# Patient Record
Sex: Female | Born: 1940 | Race: White | Hispanic: No | Marital: Married | Smoking: Never smoker
Health system: Southern US, Community
[De-identification: ages and names within clinical notes are randomized; demographics above are authoritative.]

## PROBLEM LIST (undated history)

## (undated) DIAGNOSIS — C801 Malignant (primary) neoplasm, unspecified: Secondary | ICD-10-CM

## (undated) HISTORY — PX: ABDOMINAL HYSTERECTOMY: SHX81

---

## 2007-07-05 DIAGNOSIS — Z9889 Other specified postprocedural states: Secondary | ICD-10-CM | POA: Insufficient documentation

## 2011-12-18 DIAGNOSIS — M1991 Primary osteoarthritis, unspecified site: Secondary | ICD-10-CM | POA: Insufficient documentation

## 2011-12-18 DIAGNOSIS — M797 Fibromyalgia: Secondary | ICD-10-CM | POA: Insufficient documentation

## 2011-12-18 DIAGNOSIS — L719 Rosacea, unspecified: Secondary | ICD-10-CM | POA: Insufficient documentation

## 2012-01-14 ENCOUNTER — Ambulatory Visit: Payer: Self-pay | Admitting: Unknown Physician Specialty

## 2012-01-14 LAB — CREATININE, SERUM
Creatinine: 0.7 mg/dL (ref 0.60–1.30)
EGFR (African American): >60
EGFR (Non-African Amer.): >60

## 2012-07-14 DIAGNOSIS — F330 Major depressive disorder, recurrent, mild: Secondary | ICD-10-CM | POA: Insufficient documentation

## 2012-07-14 DIAGNOSIS — R739 Hyperglycemia, unspecified: Secondary | ICD-10-CM | POA: Insufficient documentation

## 2013-07-20 DIAGNOSIS — Z800 Family history of malignant neoplasm of digestive organs: Secondary | ICD-10-CM | POA: Insufficient documentation

## 2013-07-20 DIAGNOSIS — I10 Essential (primary) hypertension: Secondary | ICD-10-CM | POA: Insufficient documentation

## 2013-09-20 DIAGNOSIS — M62830 Muscle spasm of back: Secondary | ICD-10-CM | POA: Insufficient documentation

## 2014-01-11 DIAGNOSIS — E039 Hypothyroidism, unspecified: Secondary | ICD-10-CM | POA: Insufficient documentation

## 2014-01-22 DIAGNOSIS — M8580 Other specified disorders of bone density and structure, unspecified site: Secondary | ICD-10-CM | POA: Insufficient documentation

## 2014-01-22 DIAGNOSIS — R0609 Other forms of dyspnea: Secondary | ICD-10-CM | POA: Insufficient documentation

## 2014-01-22 DIAGNOSIS — K219 Gastro-esophageal reflux disease without esophagitis: Secondary | ICD-10-CM | POA: Insufficient documentation

## 2014-01-22 DIAGNOSIS — I2510 Atherosclerotic heart disease of native coronary artery without angina pectoris: Secondary | ICD-10-CM | POA: Insufficient documentation

## 2014-01-22 DIAGNOSIS — Z0000 Encounter for general adult medical examination without abnormal findings: Secondary | ICD-10-CM | POA: Insufficient documentation

## 2014-01-22 DIAGNOSIS — E785 Hyperlipidemia, unspecified: Secondary | ICD-10-CM | POA: Insufficient documentation

## 2014-01-22 DIAGNOSIS — E538 Deficiency of other specified B group vitamins: Secondary | ICD-10-CM | POA: Insufficient documentation

## 2014-06-30 ENCOUNTER — Ambulatory Visit
Admit: 2014-06-30 | Disposition: A | Discharge status: Home / Self Care | Payer: Self-pay | Attending: Unknown Physician Specialty | Admitting: Unknown Physician Specialty

## 2014-07-26 DIAGNOSIS — Z974 Presence of external hearing-aid: Secondary | ICD-10-CM | POA: Insufficient documentation

## 2015-08-17 DIAGNOSIS — Z9889 Other specified postprocedural states: Secondary | ICD-10-CM | POA: Insufficient documentation

## 2016-02-06 DIAGNOSIS — M1711 Unilateral primary osteoarthritis, right knee: Secondary | ICD-10-CM | POA: Insufficient documentation

## 2016-04-04 DIAGNOSIS — R0602 Shortness of breath: Secondary | ICD-10-CM | POA: Insufficient documentation

## 2019-06-30 DIAGNOSIS — Z860101 Personal history of adenomatous and serrated colon polyps: Secondary | ICD-10-CM | POA: Insufficient documentation

## 2019-11-09 DIAGNOSIS — R55 Syncope and collapse: Secondary | ICD-10-CM | POA: Insufficient documentation

## 2020-09-18 NOTE — Progress Notes (Signed)
[facility] [address] [place], [place] [address]  Pulmonary Sleep Medicine   Office Visit Note  Patient Name: Gloria Cortez DOB: October 29, 1940 MRN [id number]    Chief Complaint: Obstructive Sleep Apnea visit  Brief History:  Nil is seen today for initial consult for annual follow up on APAP@12 -16cmH20. The patient has a 11 year history of sleep apnea. Patient is using PAP nightly.  The patient feels rested after sleeping with PAP.  The patient reports benefit  from PAP use. Reported sleepiness is  improved and the Epworth Sleepiness Score is 10 out of 24. The patient doesn't  take naps. Patient goes to sleep 12-1am and wakes at 6-8am.The patient complains of the following: air temp is too warm--will show how to adjust  The compliance download shows 98% compliance with an average use time of 8.5 hours. The AHI is 4.5  The patient does not complain of limb movements disrupting sleep.  ROS  General: (-) fever, (-) chills, (-) night sweat Nose and Sinuses: (-) nasal stuffiness or itchiness, (-) postnasal drip, (-) nosebleeds, (-) sinus trouble. Mouth and Throat: (-) sore throat, (-) hoarseness. Neck: (-) swollen glands, (-) enlarged thyroid, (-) neck pain. Respiratory: - cough, - shortness of breath, - wheezing. Neurologic: - numbness, - tingling. Psychiatric: - anxiety, - depression   Current Medication: Outpatient Encounter Medications as of 09/19/2020  Medication Sig   clonazePAM (KLONOPIN) 0.5 MG tablet Take by mouth.   cyclobenzaprine (FLEXERIL) 10 MG tablet TAKE ONE TABLET BY MOUTH THREE TIMES DAILY AS NEEDED FOR SPASM   escitalopram (LEXAPRO) 10 MG tablet    furosemide (LASIX) 40 MG tablet furosemide 40 mg tablet   levothyroxine (SYNTHROID) 125 MCG tablet levothyroxine 125 mcg tablet   losartan (COZAAR) 50 MG tablet TAKE 1 TABLET(50 MG) BY MOUTH TWICE DAILY   metoprolol succinate (TOPROL-XL) 25 MG 24 hr tablet Take by mouth.   rosuvastatin (CRESTOR) 10 MG  tablet rosuvastatin 10 mg tablet   aspirin 325 MG tablet aspirin 325 mg tablet  Take 1 tablet every day by oral route.   nitroGLYCERIN (NITROSTAT) 0.4 MG SL tablet nitroglycerin 0.4 mg sublingual tablet  Place by sublingual route.   No facility-administered encounter medications on file as of 09/19/2020.    Surgical History: History reviewed. No pertinent surgical history.  Medical History: History reviewed. No pertinent past medical history.  Family History: Non contributory to the present illness  Social History: Social History   Socioeconomic History   Marital status: Married    Spouse name: Not on file   Number of children: Not on file   Years of education: Not on file   Highest education level: Not on file  Occupational History   Not on file  Tobacco Use   Smoking status: Never   Smokeless tobacco: Never  Substance and Sexual Activity   Alcohol use: Not Currently   Drug use: Not on file   Sexual activity: Not on file  Other Topics Concern   Not on file  Social History Narrative   Not on file   Social Determinants of Health   Financial Resource Strain: Not on file  Food Insecurity: Not on file  Transportation Needs: Not on file  Physical Activity: Not on file  Stress: Not on file  Social Connections: Not on file  Intimate Partner Violence: Not on file    Vital Signs: Blood pressure 113/69, pulse 70, temperature 98.1 F (36.7 C), resp. rate 18, height 4\' 10"  (1.473 m), weight  197 lb (89.4 kg), SpO2 96 %.  Examination: General Appearance: The patient is well-developed, well-nourished, and in no distress. Neck Circumference: 40cm Skin: Gross inspection of skin unremarkable. Head: normocephalic, no gross deformities. Eyes: no gross deformities noted. ENT: ears appear grossly normal Neurologic: Alert and oriented. No involuntary movements.    EPWORTH SLEEPINESS SCALE:  Scale:  (0)= no chance of dozing; (1)= slight chance of dozing; (2)= moderate  chance of dozing; (3)= high chance of dozing  Chance  Situtation    Sitting and reading: 2    Watching TV: 2    Sitting Inactive in public: 1    As a passenger in car: 2      Lying down to rest: 0    Sitting and talking: 1    Sitting quielty after lunch: 2    In a car, stopped in traffic: 0   TOTAL SCORE:   10 out of 24    SLEEP STUDIES:  PSG 10/12 AHI 66 Spo60min 80%   CPAP COMPLIANCE DATA:  Date Range: 09/17/19-09/15/20  Average Daily Use: 8.5 hours  Median Use: 8.9  Compliance for > 4 Hours: 99%   AHI: 4.5 respiratory events per hour  Days Used: 361/365  Mask Leak: 43.7  95th Percentile Pressure: APAP 12-16         LABS: No results found for this or any previous visit (from the past 2160 hour(s)).  Radiology: MR Brain W Wo Contrast  Result Date: 06/30/2014 CLINICAL DATA:  Dizziness. Headache. Hearing loss. Symptoms began 2 years ago after falling in the shower with trauma to the back of the head. EXAM: MRI HEAD WITHOUT AND WITH CONTRAST TECHNIQUE: Multiplanar, multiecho pulse sequences of the brain and surrounding structures were obtained without and with intravenous contrast. CONTRAST:  20 cc MultiHance COMPARISON:  01/14/2012 FINDINGS: Diffusion imaging does not show any acute or subacute infarction. The brainstem and cerebellum are normal. The cerebral hemispheres show moderate changes of chronic small vessel disease affecting the deep and subcortical white matter. No cortical or large vessel territory infarction. No mass lesion, hemorrhage, hydrocephalus or extra-axial collection. CP angle regions are normal bilaterally. Seventh and eighth nerve complexes are normal. No vestibular schwannoma. No abnormal brain or meningeal enhancement. No fluid in the sinuses, middle ears or mastoids. No skull or skullbase lesion. 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 No acute or reversible finding. No  vestibular schwannoma. Electronically Signed   By: Paulina Fusi M.D.   On: 06/30/2014 18:34    No results found.  No results found.    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 The patient was reminded how to adjust humidity and advised to change supplies regularly to avoid mask leak. The patient was also counselled on nightly use. The compliance is excellent. The AHI is 4.5.   1. Obstructive sleep apnea syndrome Continue excellent compliance  2. CPAP use counseling CPAP couseling-Discussed importance of adequate CPAP use as well as proper care and cleaning techniques of machine and all supplies.  3. Essential (primary) hypertension Continue current medication and f/u with PCP.  4. Acquired hypothyroidism Continue current medication and f/u with PCP.  5. Mild recurrent major depression (HCC) Stable, continue current medication  6. Morbid obesity with body mass index (BMI) of 40.0 to 44.9 in adult [facility]) Obesity Counseling: Had a lengthy discussion regarding patients BMI and weight issues. Patient was instructed on portion control as well as increased activity. Also discussed caloric restrictions with trying to maintain intake less than 2000 Kcal. Discussions were made in accordance with the 5As of weight management. Simple actions such as not eating late and if able to, taking a walk is suggested.    General Counseling: I have discussed the findings of the evaluation and examination with Marialuiza.  I have also discussed any  further diagnostic evaluation thatmay be needed or ordered today. Tishawna verbalizes understanding of the findings of todays visit. We also reviewed her medications today and discussed drug interactions and side effects including but not limited excessive drowsiness and altered mental states. We also discussed that there is always a risk not just to her but also people around her. she has been encouraged to call the office with any questions or concerns that should arise related to todays visit.  No orders of the defined types were placed in this encounter.       I have personally obtained a history, examined the patient, evaluated laboratory and imaging results, formulated the assessment and plan and placed orders.  This patient was seen by Lynn Ito, PA-C in collaboration with Dr. Freda Munro as a part of collaborative care agreement.   Valentino Hue Sol Blazing, PhD, FAASM  Diplomate, American Board of Sleep Medicine    Yevonne Pax, MD [facility] Diplomate ABMS Pulmonary and Critical Care Medicine Sleep medicine

## 2020-09-19 ENCOUNTER — Ambulatory Visit (INDEPENDENT_AMBULATORY_CARE_PROVIDER_SITE_OTHER): Payer: Medicare PPO | Admitting: Internal Medicine

## 2020-09-19 VITALS — BP 113/69 | HR 70 | Temp 98.1°F | Resp 18 | Ht <= 58 in | Wt 197.0 lb

## 2020-09-19 DIAGNOSIS — G4700 Insomnia, unspecified: Secondary | ICD-10-CM | POA: Insufficient documentation

## 2020-09-19 DIAGNOSIS — G4733 Obstructive sleep apnea (adult) (pediatric): Secondary | ICD-10-CM | POA: Diagnosis not present

## 2020-09-19 DIAGNOSIS — F330 Major depressive disorder, recurrent, mild: Secondary | ICD-10-CM

## 2020-09-19 DIAGNOSIS — Z6841 Body Mass Index (BMI) 40.0 and over, adult: Secondary | ICD-10-CM

## 2020-09-19 DIAGNOSIS — E039 Hypothyroidism, unspecified: Secondary | ICD-10-CM | POA: Diagnosis not present

## 2020-09-19 DIAGNOSIS — I10 Essential (primary) hypertension: Secondary | ICD-10-CM | POA: Diagnosis not present

## 2020-09-19 DIAGNOSIS — Z7189 Other specified counseling: Secondary | ICD-10-CM

## 2020-09-19 DIAGNOSIS — F32A Depression, unspecified: Secondary | ICD-10-CM | POA: Insufficient documentation

## 2020-09-19 DIAGNOSIS — G4730 Sleep apnea, unspecified: Secondary | ICD-10-CM | POA: Insufficient documentation

## 2020-09-19 NOTE — Patient Instructions (Signed)

## 2021-03-18 DIAGNOSIS — R350 Frequency of micturition: Secondary | ICD-10-CM | POA: Insufficient documentation

## 2021-03-25 DIAGNOSIS — L209 Atopic dermatitis, unspecified: Secondary | ICD-10-CM | POA: Insufficient documentation

## 2021-09-17 NOTE — Progress Notes (Signed)
No show for appointment. Office will call to reschedule.  

## 2021-09-18 ENCOUNTER — Ambulatory Visit: Payer: Medicare PPO | Admitting: Internal Medicine

## 2021-09-18 DIAGNOSIS — Z91199 Patient's noncompliance with other medical treatment and regimen due to unspecified reason: Secondary | ICD-10-CM

## 2021-09-23 DIAGNOSIS — J302 Other seasonal allergic rhinitis: Secondary | ICD-10-CM | POA: Insufficient documentation

## 2021-11-25 NOTE — Progress Notes (Signed)
[facility] [address] [place], [place] [address]  Pulmonary Sleep Medicine   Office Visit Note  Patient Name: Gloria Cortez DOB: October 07, 1940 MRN [id number]    Chief Complaint: Obstructive Sleep Apnea visit  Brief History:  Gloria Cortez is seen today for an annual follow up visit for APAP@ 12-16 cmH2O. The patient has a 11 year history of sleep apnea. Patient is using PAP nightly.  The patient feels rested after sleeping with PAP.  The patient reports benefiting from PAP use. Reported sleepiness is  improved and the Epworth Sleepiness Score is 6 out of 24. The patient does take occasional naps. The patient complains of the following: place on her nose due to mask. She thinks it might be something her dermatologist will need to check due to a previous lesion there.   The compliance download shows 99% compliance with an average use time of 8 hours 50 minutes. The AHI is 4.1.  The patient does not complain of limb movements disrupting sleep. The patient continues to require PAP therapy in order to eliminate sleep apnea.   ROS  General: (-) fever, (-) chills, (-) night sweat Nose and Sinuses: (-) nasal stuffiness or itchiness, (-) postnasal drip, (-) nosebleeds, (-) sinus trouble. Mouth and Throat: (-) sore throat, (-) hoarseness. Neck: (-) swollen glands, (-) enlarged thyroid, (-) neck pain. Respiratory: - cough, - shortness of breath, - wheezing. Neurologic: - numbness, - tingling. Psychiatric: - anxiety, - depression   Current Medication: Outpatient Encounter Medications as of 11/26/2021  Medication Sig   aspirin 325 MG tablet aspirin 325 mg tablet  Take 1 tablet every day by oral route.   cyclobenzaprine (FLEXERIL) 10 MG tablet TAKE ONE TABLET BY MOUTH THREE TIMES DAILY AS NEEDED FOR SPASM   escitalopram (LEXAPRO) 10 MG tablet    furosemide (LASIX) 40 MG tablet furosemide 40 mg tablet   levothyroxine (SYNTHROID) 125 MCG tablet levothyroxine 125 mcg tablet   losartan  (COZAAR) 50 MG tablet TAKE 1 TABLET(50 MG) BY MOUTH TWICE DAILY   nitroGLYCERIN (NITROSTAT) 0.4 MG SL tablet nitroglycerin 0.4 mg sublingual tablet  Place by sublingual route.   rosuvastatin (CRESTOR) 10 MG tablet rosuvastatin 10 mg tablet   [DISCONTINUED] clonazePAM (KLONOPIN) 0.5 MG tablet Take by mouth.   [DISCONTINUED] metoprolol succinate (TOPROL-XL) 25 MG 24 hr tablet Take by mouth.   No facility-administered encounter medications on file as of 11/26/2021.    Surgical History: History reviewed. No pertinent surgical history.  Medical History: History reviewed. No pertinent past medical history.  Family History: Non contributory to the present illness  Social History: Social History   Socioeconomic History   Marital status: Married    Spouse name: Not on file   Number of children: Not on file   Years of education: Not on file   Highest education level: Not on file  Occupational History   Not on file  Tobacco Use   Smoking status: Never   Smokeless tobacco: Never  Substance and Sexual Activity   Alcohol use: Not Currently   Drug use: Not on file   Sexual activity: Not on file  Other Topics Concern   Not on file  Social History Narrative   Not on file   Social Determinants of Health   Financial Resource Strain: Not on file  Food Insecurity: Not on file  Transportation Needs: Not on file  Physical Activity: Not on file  Stress: Not on file  Social Connections: Not on file  Intimate Partner Violence:  Not on file    Vital Signs: Blood pressure 117/65, pulse 63, resp. rate 12, height 4\' 11"  (1.499 m), weight 192 lb (87.1 kg), SpO2 95 %. Body mass index is 38.78 kg/m.    Examination: General Appearance: The patient is well-developed, well-nourished, and in no distress. Neck Circumference: 40 cm Skin: Gross inspection of skin unremarkable. Head: normocephalic, no gross deformities. Eyes: no gross deformities noted. ENT: ears appear grossly  normal Neurologic: Alert and oriented. No involuntary movements.  STOP BANG RISK ASSESSMENT S (snore) Have you been told that you snore?     NO   T (tired) Are you often tired, fatigued, or sleepy during the day?   NO  O (obstruction) Do you stop breathing, choke, or gasp during sleep? NO   P (pressure) Do you have or are you being treated for high blood pressure? YES   B (BMI) Is your body index greater than 35 kg/m? YES   A (age) Are you 89 years old or older? YES   N (neck) Do you have a neck circumference greater than 16 inches?   NO   G (gender) Are you a female? NO   TOTAL STOP/BANG "YES" ANSWERS 3       A STOP-Bang score of 2 or less is considered low risk, and a score of 5 or more is high risk for having either moderate or severe OSA. For people who score 3 or 4, doctors may need to perform further assessment to determine how likely they are to have OSA.         EPWORTH SLEEPINESS SCALE:  Scale:  (0)= no chance of dozing; (1)= slight chance of dozing; (2)= moderate chance of dozing; (3)= high chance of dozing  Chance  Situtation    Sitting and reading: 0    Watching TV: 2    Sitting Inactive in public: 1    As a passenger in car: 2      Lying down to rest: 0    Sitting and talking: 0    Sitting quielty after lunch: 1    In a car, stopped in traffic: 0   TOTAL SCORE:   6 out of 24    SLEEP STUDIES:  PSG (12/2010) AHI 66/hr, min SpO2 80% Titration (12/2010) CPAP@ 13 cmH2O   CPAP COMPLIANCE DATA:  Date Range: 11/21/2020-11/20/2021  Average Daily Use: 8 hours 50 minutes  Median Use: 8 hours 55 minutes  Compliance for > 4 Hours: 99%  AHI: 4.1 respiratory events per hour  Days Used: 364/365 days  Mask Leak: 53.4  95th Percentile Pressure: 14.3         LABS: No results found for this or any previous visit (from the past 2160 hour(s)).  Radiology: MR Brain W Wo Contrast  Result Date: 06/30/2014 CLINICAL DATA:  Dizziness.  Headache. Hearing loss. Symptoms began 2 years ago after falling in the shower with trauma to the back of the head. EXAM: MRI HEAD WITHOUT AND WITH CONTRAST TECHNIQUE: Multiplanar, multiecho pulse sequences of the brain and surrounding structures were obtained without and with intravenous contrast. CONTRAST:  20 cc MultiHance COMPARISON:  01/14/2012 FINDINGS: Diffusion imaging does not show any acute or subacute infarction. The brainstem and cerebellum are normal. The cerebral hemispheres show moderate changes of chronic small vessel disease affecting the deep and subcortical white matter. No cortical or large vessel territory infarction. No mass lesion, hemorrhage, hydrocephalus or extra-axial collection. CP angle regions are normal bilaterally. Seventh and eighth nerve  complexes are normal. No vestibular schwannoma. No abnormal brain or meningeal enhancement. No fluid in the sinuses, middle ears or mastoids. No skull or skullbase lesion. 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 No acute or reversible finding. No vestibular schwannoma. Electronically Signed   By: Nelson Chimes M.D.   On: 06/30/2014 18:34    No results found.  No results found.    Assessment and Plan: Patient Active Problem List   Diagnosis Date Noted   Depression 09/19/2020   Insomnia 09/19/2020   Sleep apnea 09/19/2020   Gastroesophageal reflux disease without esophagitis 01/22/2014   Acquired hypothyroidism 01/11/2014   Essential (primary) hypertension 07/20/2013   Mild recurrent major depression ([place]) 07/14/2012   Morbid obesity with body mass index (BMI) of 40.0 to 44.9 in adult ([place]) 07/14/2012      The patient does tolerate PAP and reports benefit from PAP use. The patient was reminded how to adjust mask dit and advised to change supplies regularly. The patient was also counselled on nightly use. The compliance is excellent. The AHI is 4.1. Pt continues to  require cpap to treat his osa and is medically necessary.   1. Obstructive sleep apnea syndrome Continue excellent compliance  2. CPAP use counseling CPAP couseling-Discussed importance of adequate CPAP use as well as proper care and cleaning techniques of machine and all supplies.  3. Essential (primary) hypertension    4. Acquired hypothyroidism Continue current medication and f/u with PCP.  5. Mild recurrent major depression ([place]) Continue current medication and f/u with PCP.  6. Obesity (BMI 30-39.9) Obesity Counseling: Had a lengthy discussion regarding patients BMI and weight issues. Patient was instructed on portion control as well as increased activity. Also discussed caloric restrictions with trying to maintain intake less than 2000 Kcal. Discussions were made in accordance with the 5As of weight management. Simple actions such as not eating late and if able to, taking a walk is suggested.    General Counseling: I have discussed the findings of the evaluation and examination with Kattaleya.  I have also discussed any further diagnostic evaluation thatmay be needed or ordered today. Jarika verbalizes understanding of the findings of todays visit. We also reviewed her medications today and discussed drug interactions and side effects including but not limited excessive drowsiness and altered mental states. We also discussed that there is always a risk not just to her but also people around her. she has been encouraged to call the office with any questions or concerns that should arise related to todays visit.  No orders of the defined types were placed in this encounter.       I have personally obtained a history, examined the patient, evaluated laboratory and imaging results, formulated the assessment and plan and placed orders.  This patient was seen by Drema Dallas, PA-C in collaboration with Dr. Devona Konig as a part of collaborative care agreement.  Allyne Gee, MD  [facility] Diplomate ABMS Pulmonary Critical Care Medicine and Sleep Medicine

## 2021-11-26 ENCOUNTER — Ambulatory Visit (INDEPENDENT_AMBULATORY_CARE_PROVIDER_SITE_OTHER): Payer: Medicare PPO | Admitting: Internal Medicine

## 2021-11-26 VITALS — BP 117/65 | HR 63 | Resp 12 | Ht 59.0 in | Wt 192.0 lb

## 2021-11-26 DIAGNOSIS — E039 Hypothyroidism, unspecified: Secondary | ICD-10-CM

## 2021-11-26 DIAGNOSIS — G4733 Obstructive sleep apnea (adult) (pediatric): Secondary | ICD-10-CM

## 2021-11-26 DIAGNOSIS — I10 Essential (primary) hypertension: Secondary | ICD-10-CM | POA: Diagnosis not present

## 2021-11-26 DIAGNOSIS — F330 Major depressive disorder, recurrent, mild: Secondary | ICD-10-CM

## 2021-11-26 DIAGNOSIS — E669 Obesity, unspecified: Secondary | ICD-10-CM

## 2021-11-26 DIAGNOSIS — Z7189 Other specified counseling: Secondary | ICD-10-CM

## 2021-11-26 NOTE — Patient Instructions (Signed)

## 2022-03-26 DIAGNOSIS — R002 Palpitations: Secondary | ICD-10-CM | POA: Insufficient documentation

## 2022-03-26 DIAGNOSIS — K5901 Slow transit constipation: Secondary | ICD-10-CM | POA: Insufficient documentation

## 2022-09-23 DIAGNOSIS — H8110 Benign paroxysmal vertigo, unspecified ear: Secondary | ICD-10-CM | POA: Insufficient documentation

## 2022-10-05 DIAGNOSIS — L409 Psoriasis, unspecified: Secondary | ICD-10-CM | POA: Insufficient documentation

## 2022-10-05 DIAGNOSIS — G311 Senile degeneration of brain, not elsewhere classified: Secondary | ICD-10-CM | POA: Insufficient documentation

## 2022-11-24 NOTE — Progress Notes (Unsigned)
[facility] [address] [place], [place] [address]  Pulmonary Sleep Medicine   Office Visit Note  Patient Name: Gloria Cortez DOB: Nov 28, 1940 MRN [id number]    Chief Complaint: Obstructive Sleep Apnea visit  Brief History:  Gloria Cortez is seen today for an annual follow up visit sleep re-evaluation sleep evaluation a replacement unit. Patient's unit is set for APAP@ 12-16 cmH2O but has accidentally been using her husband's old unit set to CPAP@ 11 cmH2O as both units look identical. The patient has a 12 year history of sleep apnea. Patient is using PAP nightly.  The patient feels rested after sleeping with PAP.  The patient reports benefiting from PAP use. Reported sleepiness is  improved and the Epworth Sleepiness Score is 8 out of 24. The patient does  take naps. The patient complains of the following: patient is in need of a replacement. The compliance download shows 94% compliance with an average use time of 8 hours 9 minutes. The AHI is 11.8.  The patient does not complain of limb movements disrupting sleep. The patient continues to require PAP therapy in order to eliminate sleep apnea.   ROS  General: (-) fever, (-) chills, (-) night sweat Nose and Sinuses: (-) nasal stuffiness or itchiness, (-) postnasal drip, (-) nosebleeds, (-) sinus trouble. Mouth and Throat: (-) sore throat, (-) hoarseness. Neck: (-) swollen glands, (-) enlarged thyroid, (-) neck pain. Respiratory: - cough, - shortness of breath, - wheezing. Neurologic: - numbness, - tingling. Psychiatric: - anxiety, - depression   Current Medication: Outpatient Encounter Medications as of 11/25/2022  Medication Sig   DULoxetine (CYMBALTA) 30 MG capsule Take 30 mg by mouth daily.   aspirin 325 MG tablet aspirin 325 mg tablet  Take 1 tablet every day by oral route.   cyclobenzaprine (FLEXERIL) 10 MG tablet TAKE ONE TABLET BY MOUTH THREE TIMES DAILY AS NEEDED FOR SPASM   furosemide (LASIX) 40 MG tablet  furosemide 40 mg tablet   levothyroxine (SYNTHROID) 125 MCG tablet levothyroxine 125 mcg tablet   losartan (COZAAR) 50 MG tablet TAKE 1 TABLET(50 MG) BY MOUTH TWICE DAILY   meclizine (ANTIVERT) 25 MG tablet Take 1 tablet by mouth 3 (three) times daily.   nitroGLYCERIN (NITROSTAT) 0.4 MG SL tablet nitroglycerin 0.4 mg sublingual tablet  Place by sublingual route.   rosuvastatin (CRESTOR) 10 MG tablet rosuvastatin 10 mg tablet   [DISCONTINUED] escitalopram (LEXAPRO) 10 MG tablet    No facility-administered encounter medications on file as of 11/25/2022.    Surgical History: History reviewed. No pertinent surgical history.  Medical History: History reviewed. No pertinent past medical history.  Family History: Non contributory to the present illness  Social History: Social History   Socioeconomic History   Marital status: Married    Spouse name: Not on file   Number of children: Not on file   Years of education: Not on file   Highest education level: Not on file  Occupational History   Not on file  Tobacco Use   Smoking status: Never   Smokeless tobacco: Never  Substance and Sexual Activity   Alcohol use: Not Currently   Drug use: Not on file   Sexual activity: Not on file  Other Topics Concern   Not on file  Social History Narrative   Not on file   Social Determinants of Health   Financial Resource Strain: Low Risk  (03/21/2020)   Received from [facility], [facility]   Overall Financial Resource Strain (CARDIA)  Difficulty of Paying Living Expenses: Not very hard  Food Insecurity: No Food Insecurity (03/21/2020)   Received from [facility], [facility]   Hunger Vital Sign    Worried About Running Out of Food in the Last Year: Never true    Ran Out of Food in the Last Year: Never true  Transportation Needs: No Transportation Needs (03/21/2020)   Received from [facility], [facility]   [facility] - Transportation    Lack of Transportation (Medical): No     Lack of Transportation (Non-Medical): No  Physical Activity: Inactive (03/21/2020)   Received from [facility], [facility]   Exercise Vital Sign    Days of Exercise per Week: 0 days    Minutes of Exercise per Session: 0 min  Stress: Stress Concern Present (03/21/2020)   Received from [facility], [facility] Occupational Health - Occupational Stress Questionnaire    Feeling of Stress : To some extent  Social Connections: Unknown (07/14/2021)   Received from [facility], [facility]   Social Network    Social Network: Not on file  Intimate Partner Violence: Unknown (06/05/2021)   Received from [facility], [facility]   HITS    Physically Hurt: Not on file    Insult or Talk Down To: Not on file    Threaten Physical Harm: Not on file    Scream or Curse: Not on file    Vital Signs: Blood pressure 133/68, pulse 68, resp. rate 16, height 4\' 10"  (1.473 m), weight 191 lb (86.6 kg), SpO2 97%. Body mass index is 39.92 kg/m.    Examination: General Appearance: The patient is well-developed, well-nourished, and in no distress. Neck Circumference: 40 cm Skin: Gross inspection of skin unremarkable. Head: normocephalic, no gross deformities. Eyes: no gross deformities noted. ENT: ears appear grossly normal Neurologic: Alert and oriented. No involuntary movements.  STOP BANG RISK ASSESSMENT S (snore) Have you been told that you snore?     NO   T (tired) Are you often tired, fatigued, or sleepy during the day?   YES  O (obstruction) Do you stop breathing, choke, or gasp during sleep? NO   P (pressure) Do you have or are you being treated for high blood pressure? YES   B (BMI) Is your body index greater than 35 kg/m? YES   A (age) Are you 60 years old or older? YES   N (neck) Do you have a neck circumference greater than 16 inches?   NO   G (gender) Are you a female? NO   TOTAL STOP/BANG "YES" ANSWERS 3       A STOP-Bang score of 2 or  less is considered low risk, and a score of 5 or more is high risk for having either moderate or severe OSA. For people who score 3 or 4, doctors may need to perform further assessment to determine how likely they are to have OSA.         EPWORTH SLEEPINESS SCALE:  Scale:  (0)= no chance of dozing; (1)= slight chance of dozing; (2)= moderate chance of dozing; (3)= high chance of dozing  Chance  Situtation    Sitting and reading: 2    Watching TV: 2    Sitting Inactive in public: 1    As a passenger in car: 1      Lying down to rest: 0    Sitting and talking: 1    Sitting quielty after lunch: 1  In a car, stopped in traffic: 0   TOTAL SCORE:   8 out of 24    SLEEP STUDIES:  PSG (12/2010) AHI 66/hr, min SpO2 80% Titration (12/2010) CPAP@ 13 cmH2O   CPAP COMPLIANCE DATA:  Date Range: 08/30/2022-11/24/2022  Average Daily Use: 8 hours 9 minutes  Median Use: 8 hours 14 minutes  Compliance for > 4 Hours: 94%  AHI: 11.8 respiratory events per hour  Days Used:  83/87 days  Mask Leak: 40.1  95th Percentile Pressure: 11         LABS: No results found for this or any previous visit (from the past 2160 hour(s)).  Radiology: MR Brain W Wo Contrast  Result Date: 06/30/2014 CLINICAL DATA:  Dizziness. Headache. Hearing loss. Symptoms began 2 years ago after falling in the shower with trauma to the back of the head. EXAM: MRI HEAD WITHOUT AND WITH CONTRAST TECHNIQUE: Multiplanar, multiecho pulse sequences of the brain and surrounding structures were obtained without and with intravenous contrast. CONTRAST:  20 cc MultiHance COMPARISON:  01/14/2012 FINDINGS: Diffusion imaging does not show any acute or subacute infarction. The brainstem and cerebellum are normal. The cerebral hemispheres show moderate changes of chronic small vessel disease affecting the deep and subcortical white matter. No cortical or large vessel territory infarction. No mass lesion, hemorrhage,  hydrocephalus or extra-axial collection. CP angle regions are normal bilaterally. Seventh and eighth nerve complexes are normal. No vestibular schwannoma. No abnormal brain or meningeal enhancement. No fluid in the sinuses, middle ears or mastoids. No skull or skullbase lesion. 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 No acute or reversible finding. No vestibular schwannoma. Electronically Signed   By: Paulina Fusi M.D.   On: 06/30/2014 18:34    No results found.  No results found.    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 Obstructive sleep apnea syndrome The patient does tolerate PAP and reports  benefit from PAP use. The patient was reminded how to clean equipment and advised to replace supplies routinely. The patient was also counselled on weight loss. The compliance is excellent. The AHI is 11.8 due to being at the wrong pressure. Machine is past end of life and must be replaced.   OSA on cpap- not controlled due to being at the wrong pressure. Resume use of APAP 12-16. Replace machine. F/u after setup. CPAP continues to be medically necessary to treat this patient's OSA.    2. CPAP use counseling CPAP Counseling: had a lengthy discussion with the patient regarding the importance of PAP therapy in management of the sleep apnea. Patient appears to understand the risk factor reduction and also understands the risks associated with untreated sleep apnea. Patient will try to make a good faith effort to remain compliant with therapy. Also  instructed the patient on proper cleaning of the device including the water must be changed daily if possible and use of distilled water is preferred. Patient understands that the machine should be regularly cleaned with appropriate recommended cleaning solutions that do not damage the PAP machine for example given white vinegar and water rinses.  Other methods such as ozone treatment may not be as good as these simple methods to achieve cleaning.  3. Essential (primary) hypertension Hypertension Counseling:   The following hypertensive lifestyle modification were recommended and discussed:  1. Limiting alcohol intake to less than 1 oz/day of ethanol:(24 oz of beer or 8 oz of wine or 2 oz of 100-proof whiskey). 2. Take baby ASA 81 mg daily. 3. Importance of regular aerobic exercise and losing weight. 4. Reduce dietary saturated fat and cholesterol intake for overall cardiovascular health. 5. Maintaining adequate dietary potassium, calcium, and magnesium intake. 6. Regular monitoring of the blood pressure. 7. Reduce sodium intake to less than 100 mmol/day (less than 2.3 gm of sodium or less than 6 gm of sodium choride)       General Counseling: I have discussed the findings of the evaluation and examination with Cerissa.  I have also discussed any further diagnostic evaluation thatmay be needed or ordered today. Evany verbalizes understanding of the findings of todays visit. We also reviewed her medications today and discussed drug interactions and side effects including but not limited excessive drowsiness and altered mental states. We also discussed that there is always a risk not just to her but also people around her. she has been encouraged to call the office with any questions or concerns that should arise related to todays visit.  No orders of the defined types were placed in this encounter.       I have personally obtained a history, examined the patient, evaluated laboratory and imaging  results, formulated the assessment and plan and placed orders. This patient was seen today by Emmaline Kluver, PA-C in collaboration with Dr. Freda Munro.   Yevonne Pax, MD [facility] Diplomate ABMS Pulmonary Critical Care Medicine and Sleep Medicine

## 2022-11-25 ENCOUNTER — Ambulatory Visit (INDEPENDENT_AMBULATORY_CARE_PROVIDER_SITE_OTHER): Payer: Medicare PPO | Admitting: Internal Medicine

## 2022-11-25 VITALS — BP 133/68 | HR 68 | Resp 16 | Ht <= 58 in | Wt 191.0 lb

## 2022-11-25 DIAGNOSIS — I10 Essential (primary) hypertension: Secondary | ICD-10-CM

## 2022-11-25 DIAGNOSIS — G4733 Obstructive sleep apnea (adult) (pediatric): Secondary | ICD-10-CM

## 2022-11-25 DIAGNOSIS — Z7189 Other specified counseling: Secondary | ICD-10-CM | POA: Diagnosis not present

## 2022-11-25 NOTE — Patient Instructions (Signed)

## 2023-04-16 DIAGNOSIS — M47812 Spondylosis without myelopathy or radiculopathy, cervical region: Secondary | ICD-10-CM | POA: Insufficient documentation

## 2023-04-26 DIAGNOSIS — E2839 Other primary ovarian failure: Secondary | ICD-10-CM | POA: Insufficient documentation

## 2023-07-07 DIAGNOSIS — C541 Malignant neoplasm of endometrium: Secondary | ICD-10-CM | POA: Insufficient documentation

## 2023-07-07 DIAGNOSIS — N950 Postmenopausal bleeding: Secondary | ICD-10-CM | POA: Insufficient documentation

## 2023-08-09 DIAGNOSIS — E1165 Type 2 diabetes mellitus with hyperglycemia: Secondary | ICD-10-CM | POA: Insufficient documentation

## 2023-08-09 DIAGNOSIS — M5459 Other low back pain: Secondary | ICD-10-CM | POA: Insufficient documentation

## 2023-11-11 DIAGNOSIS — F411 Generalized anxiety disorder: Secondary | ICD-10-CM | POA: Insufficient documentation

## 2023-11-11 DIAGNOSIS — F331 Major depressive disorder, recurrent, moderate: Secondary | ICD-10-CM | POA: Insufficient documentation

## 2023-11-24 ENCOUNTER — Ambulatory Visit (INDEPENDENT_AMBULATORY_CARE_PROVIDER_SITE_OTHER): Admitting: Internal Medicine

## 2023-11-24 VITALS — BP 154/80 | HR 78 | Resp 16 | Ht <= 58 in | Wt 187.8 lb

## 2023-11-24 DIAGNOSIS — I10 Essential (primary) hypertension: Secondary | ICD-10-CM

## 2023-11-24 DIAGNOSIS — G4733 Obstructive sleep apnea (adult) (pediatric): Secondary | ICD-10-CM

## 2023-11-24 DIAGNOSIS — F330 Major depressive disorder, recurrent, mild: Secondary | ICD-10-CM

## 2023-11-24 DIAGNOSIS — Z7189 Other specified counseling: Secondary | ICD-10-CM

## 2023-11-24 DIAGNOSIS — E039 Hypothyroidism, unspecified: Secondary | ICD-10-CM | POA: Diagnosis not present

## 2023-11-24 DIAGNOSIS — E669 Obesity, unspecified: Secondary | ICD-10-CM

## 2023-11-24 NOTE — Progress Notes (Addendum)
 [facility] [address] [place], [place] [address]  Pulmonary Sleep Medicine   Office Visit Note  Patient Name: Gloria Cortez DOB: 1941-02-02 MRN [id number]    Chief Complaint: Obstructive Sleep Apnea visit  Brief History:  Gloria Cortez is seen today for an annual follow up on CPAP@11  cmh20.  The patient has a 13 year  history of sleep apnea. Patient is using PAP nightly.  The patient feels rested after sleeping with PAP if she gets enough sleep.  The patient reports benefit from PAP use. Reported sleepiness is  somewhat improved and the Epworth Sleepiness Score is 13 out of 24. The patient not usually take naps. She does occasionally take a cat nap. The patient complains of the following: mask at nasal bridge bothers her when she tosses and turns in the bed a lot.   The compliance download shows  90% compliance with an average use time of 8 hours 44 min. The AHI is 5.0  The patient does not complain of limb movements disrupting sleep. Patient reports that in April she had a hysterectomy followed by radiation treatments. Patient is having trouble with vertigo today. The patient current machine is over 8 years old out of warranty malfunctioning and obsolete for repair. To assure reliable treatment for her OSA a replacement machine is medically necessary.  ROS  General: (-) fever, (-) chills, (-) night sweat Nose and Sinuses: (-) nasal stuffiness or itchiness, (-) postnasal drip, (-) nosebleeds, (-) sinus trouble. Mouth and Throat: (-) sore throat, (-) hoarseness. Neck: (-) swollen glands, (-) enlarged thyroid, (-) neck pain. Respiratory: - cough, - shortness of breath, - wheezing. Neurologic: - numbness, - tingling. Psychiatric: + anxiety, + depression   Current Medication: Outpatient Encounter Medications as of 11/24/2023  Medication Sig   meloxicam (MOBIC) 15 MG tablet Take 15 mg by mouth daily.   aspirin 325 MG tablet aspirin 325 mg tablet  Take 1 tablet every day by  oral route.   cyclobenzaprine (FLEXERIL) 10 MG tablet TAKE ONE TABLET BY MOUTH THREE TIMES DAILY AS NEEDED FOR SPASM   DULoxetine (CYMBALTA) 30 MG capsule Take 30 mg by mouth daily.   furosemide (LASIX) 40 MG tablet furosemide 40 mg tablet   levothyroxine (SYNTHROID) 125 MCG tablet levothyroxine 125 mcg tablet   losartan (COZAAR) 50 MG tablet TAKE 1 TABLET(50 MG) BY MOUTH TWICE DAILY   meclizine (ANTIVERT) 25 MG tablet Take 1 tablet by mouth 3 (three) times daily.   nitroGLYCERIN (NITROSTAT) 0.4 MG SL tablet nitroglycerin 0.4 mg sublingual tablet  Place by sublingual route.   rosuvastatin (CRESTOR) 10 MG tablet rosuvastatin 10 mg tablet   No facility-administered encounter medications on file as of 11/24/2023.    Surgical History: History reviewed. No pertinent surgical history.  Medical History: History reviewed. No pertinent past medical history.  Family History: Non contributory to the present illness  Social History: Social History   Socioeconomic History   Marital status: Married    Spouse name: Not on file   Number of children: Not on file   Years of education: Not on file   Highest education level: Not on file  Occupational History   Not on file  Tobacco Use   Smoking status: Never   Smokeless tobacco: Never  Substance and Sexual Activity   Alcohol use: Not Currently   Drug use: Not on file   Sexual activity: Not on file  Other Topics Concern   Not on file  Social History Narrative  Not on file   Social Drivers of Health   Financial Resource Strain: Low Risk  (03/21/2020)   Received from [facility]   Overall Financial Resource Strain (CARDIA)    Difficulty of Paying Living Expenses: Not very hard  Food Insecurity: No Food Insecurity (03/21/2020)   Received from [facility]   Hunger Vital Sign    Within the past 12 months, you worried that your food would run out before you got the money to buy more.: Never true    Within the past 12 months, the food  you bought just didn't last and you didn't have money to get more.: Never true  Transportation Needs: No Transportation Needs (03/21/2020)   Received from [facility] - Transportation    Lack of Transportation (Medical): No    Lack of Transportation (Non-Medical): No  Physical Activity: Inactive (03/21/2020)   Received from [facility]   Exercise Vital Sign    On average, how many days per week do you engage in moderate to strenuous exercise (like a brisk walk)?: 0 days    On average, how many minutes do you engage in exercise at this level?: 0 min  Stress: Stress Concern Present (03/21/2020)   Received from [facility] - Occupational Stress Questionnaire    Feeling of Stress : To some extent  Social Connections: Unknown (07/14/2021)   Received from [facility]   Social Network    Social Network: Not on file  Intimate Partner Violence: Unknown (06/05/2021)   Received from [facility]   HITS    Physically Hurt: Not on file    Insult or Talk Down To: Not on file    Threaten Physical Harm: Not on file    Scream or Curse: Not on file    Vital Signs: Blood pressure (!) 154/80, pulse 78, resp. rate 16, height 4' 10 (1.473 m), weight 187 lb 12.8 oz (85.2 kg), SpO2 97%. Body mass index is 39.25 kg/m.    Examination: General Appearance: The patient is well-developed, well-nourished, and in no distress. Neck Circumference: 41 cm Skin: Gross inspection of skin unremarkable. Head: normocephalic, no gross deformities. Eyes: no gross deformities noted. ENT: ears appear grossly normal Neurologic: Alert and oriented. No involuntary movements.  STOP BANG RISK ASSESSMENT S (snore) Have you been told that you snore?     YES   T (tired) Are you often tired, fatigued, or sleepy during the day?   NO  O (obstruction) Do you stop breathing, choke, or gasp during sleep? NO   P (pressure) Do you have or are you being treated for high  blood pressure? YES  B (BMI) Is your body index greater than 35 kg/m? YES   A (age) Are you 83 years old or older? YES   N (neck) Do you have a neck circumference greater than 16 inches?   YES   G (gender) Are you a female? NO   TOTAL STOP/BANG YES ANSWERS 5       A STOP-Bang score of 2 or less is considered low risk, and a score of 5 or more is high risk for having either moderate or severe OSA. For people who score 3 or 4, doctors may need to perform further assessment to determine how likely they are to have OSA.         EPWORTH SLEEPINESS SCALE:  Scale:  (0)= no chance of dozing; (1)= slight chance of dozing; (2)= moderate  chance of dozing; (3)= high chance of dozing  Chance  Situtation    Sitting and reading: 3    Watching TV: 3    Sitting Inactive in public: 1    As a passenger in car: 3      Lying down to rest: 1    Sitting and talking: 0    Sitting quielty after lunch: 2    In a car, stopped in traffic: 0   TOTAL SCORE:   13 out of 24    SLEEP STUDIES:  PSG (12/2010) AHI 66/hr, min Sp02 80% Titration (12/2010) CPAP @ 13 cmH2O   CPAP COMPLIANCE DATA:  Date Range: 11/25/2022-11/26/2023  Average Daily Use: 8 hours 44 min  Median Use: 8 hrs 58 min  Compliance for > 4 Hours: 90% days  AHI: 5.0 respiratory events per hour  Days Used: 329/365  Mask Leak: 68.2  95th Percentile Pressure: 11cmh20         LABS: No results found for this or any previous visit (from the past 2160 hours).  Radiology: MR Brain W Wo Contrast Result Date: 06/30/2014 CLINICAL DATA:  Dizziness. Headache. Hearing loss. Symptoms began 2 years ago after falling in the shower with trauma to the back of the head. EXAM: MRI HEAD WITHOUT AND WITH CONTRAST TECHNIQUE: Multiplanar, multiecho pulse sequences of the brain and surrounding structures were obtained without and with intravenous contrast. CONTRAST:  20 cc MultiHance COMPARISON:  01/14/2012 FINDINGS: Diffusion imaging  does not show any acute or subacute infarction. The brainstem and cerebellum are normal. The cerebral hemispheres show moderate changes of chronic small vessel disease affecting the deep and subcortical white matter. No cortical or large vessel territory infarction. No mass lesion, hemorrhage, hydrocephalus or extra-axial collection. CP angle regions are normal bilaterally. Seventh and eighth nerve complexes are normal. No vestibular schwannoma. No abnormal brain or meningeal enhancement. No fluid in the sinuses, middle ears or mastoids. No skull or skullbase lesion. 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 No acute or reversible finding. No vestibular schwannoma. Electronically Signed   By: Oneil Officer M.D.   On: 06/30/2014 18:34    No results found.  No results found.    Assessment and Plan: Patient Active Problem List   Diagnosis Date Noted   Depression 09/19/2020   Insomnia 09/19/2020   Sleep apnea 09/19/2020   Gastroesophageal reflux disease without esophagitis 01/22/2014   Acquired hypothyroidism 01/11/2014   Essential (primary) hypertension 07/20/2013   Mild recurrent major depression 07/14/2012   Morbid obesity with body mass index (BMI) of 40.0 to 44.9 in adult (HCC) 07/14/2012      The patient does tolerate PAP and reports benefit from PAP use. The patient was reminded how to adjust mask fit and advised to change supplies regularly. The patient was also counselled on nightly use. The compliance is excellent. The AHI is 5.0 with high leak. Will schedule mask fit. Patient continues to require PAP to treat their apnea and is medically necessary.  1. Obstructive sleep apnea syndrome (Primary) Continue excellent compliance  2. CPAP use counseling CPAP couseling-Discussed importance of adequate CPAP use as well as proper care and cleaning techniques of machine and all supplies.  3. Essential (primary)  hypertension Continue current medication and f/u with PCP.  4. Acquired hypothyroidism Continue current medication and f/u with PCP.  5. Mild recurrent major depression Continue current medication and f/u with PCP.  6. Obesity (BMI 30-39.9) Obesity Counseling: Had a lengthy  discussion regarding patients BMI and weight issues. Patient was instructed on portion control as well as increased activity. Also discussed caloric restrictions with trying to maintain intake less than 2000 Kcal. Discussions were made in accordance with the 5As of weight management. Simple actions such as not eating late and if able to, taking a walk is suggested.    General Counseling: I have discussed the findings of the evaluation and examination with Eulah.  I have also discussed any further diagnostic evaluation thatmay be needed or ordered today. Bertrice verbalizes understanding of the findings of todays visit. We also reviewed her medications today and discussed drug interactions and side effects including but not limited excessive drowsiness and altered mental states. We also discussed that there is always a risk not just to her but also people around her. she has been encouraged to call the office with any questions or concerns that should arise related to todays visit.  No orders of the defined types were placed in this encounter.       I have personally obtained a history, examined the patient, evaluated laboratory and imaging results, formulated the assessment and plan and placed orders.  This patient was seen by Tinnie Pro, PA-C in collaboration with Dr. Elfreda Bathe as a part of collaborative care agreement.  Elfreda DELENA Bathe, MD [facility] Diplomate ABMS Pulmonary Critical Care Medicine and Sleep Medicine

## 2023-11-24 NOTE — Patient Instructions (Signed)

## 2024-01-25 ENCOUNTER — Ambulatory Visit (INDEPENDENT_AMBULATORY_CARE_PROVIDER_SITE_OTHER)

## 2024-01-25 ENCOUNTER — Ambulatory Visit
Admission: EM | Admit: 2024-01-25 | Discharge: 2024-01-25 | Disposition: A | Discharge status: Home / Self Care | Attending: Family Medicine | Admitting: Family Medicine

## 2024-01-25 DIAGNOSIS — R051 Acute cough: Secondary | ICD-10-CM

## 2024-01-25 DIAGNOSIS — R262 Difficulty in walking, not elsewhere classified: Secondary | ICD-10-CM | POA: Insufficient documentation

## 2024-01-25 DIAGNOSIS — J209 Acute bronchitis, unspecified: Secondary | ICD-10-CM | POA: Insufficient documentation

## 2024-01-25 DIAGNOSIS — J309 Allergic rhinitis, unspecified: Secondary | ICD-10-CM | POA: Insufficient documentation

## 2024-01-25 DIAGNOSIS — N760 Acute vaginitis: Secondary | ICD-10-CM | POA: Insufficient documentation

## 2024-01-25 DIAGNOSIS — N898 Other specified noninflammatory disorders of vagina: Secondary | ICD-10-CM | POA: Insufficient documentation

## 2024-01-25 DIAGNOSIS — R300 Dysuria: Secondary | ICD-10-CM | POA: Insufficient documentation

## 2024-01-25 DIAGNOSIS — R600 Localized edema: Secondary | ICD-10-CM | POA: Insufficient documentation

## 2024-01-25 DIAGNOSIS — I872 Venous insufficiency (chronic) (peripheral): Secondary | ICD-10-CM | POA: Insufficient documentation

## 2024-01-25 DIAGNOSIS — N3001 Acute cystitis with hematuria: Secondary | ICD-10-CM | POA: Diagnosis present

## 2024-01-25 DIAGNOSIS — M25559 Pain in unspecified hip: Secondary | ICD-10-CM | POA: Insufficient documentation

## 2024-01-25 DIAGNOSIS — E876 Hypokalemia: Secondary | ICD-10-CM | POA: Insufficient documentation

## 2024-01-25 DIAGNOSIS — I8390 Asymptomatic varicose veins of unspecified lower extremity: Secondary | ICD-10-CM | POA: Insufficient documentation

## 2024-01-25 HISTORY — DX: Malignant (primary) neoplasm, unspecified: C80.1

## 2024-01-25 LAB — POCT URINE DIPSTICK
Glucose, UA: NEGATIVE mg/dL
Nitrite, UA: NEGATIVE
Spec Grav, UA: 1.015 (ref 1.010–1.025)
Urobilinogen, UA: 1 U/dL
pH, UA: 6 (ref 5.0–8.0)

## 2024-01-25 LAB — POCT RESPIRATORY SYNCYTIAL VIRUS: RSV Antigen, POC: NEGATIVE

## 2024-01-25 LAB — POC COVID19/FLU A&B COMBO
Covid Antigen, POC: NEGATIVE
Influenza A Antigen, POC: NEGATIVE
Influenza B Antigen, POC: NEGATIVE

## 2024-01-25 MED ORDER — DOXYCYCLINE HYCLATE 100 MG PO CAPS: 100.0000 mg | ORAL_CAPSULE | Freq: Two times a day (BID) | ORAL | 0 refills | Status: AC

## 2024-01-25 MED ORDER — DOXYCYCLINE HYCLATE 100 MG PO CAPS: 100.0000 mg | ORAL_CAPSULE | Freq: Two times a day (BID) | ORAL | 0 refills | Status: DC

## 2024-01-25 MED ORDER — CEPHALEXIN 500 MG PO CAPS: 500.0000 mg | ORAL_CAPSULE | Freq: Three times a day (TID) | ORAL | 0 refills | Status: DC

## 2024-01-25 MED ORDER — CEPHALEXIN 500 MG PO CAPS: 500.0000 mg | ORAL_CAPSULE | Freq: Three times a day (TID) | ORAL | 0 refills | Status: AC

## 2024-01-25 MED ORDER — PREDNISONE 20 MG PO TABS: 40.0000 mg | ORAL_TABLET | Freq: Every day | ORAL | 0 refills | Status: AC

## 2024-01-25 NOTE — ED Provider Notes (Signed)
 [facility]    CSN: [id number] Arrival date & time: 01/25/24  1215      History   Chief Complaint Chief Complaint  Patient presents with  . Cough  . Urinary Frequency  . Dysuria    HPI Jaleah Lefevre is a 83 y.o. female.   HPI  History obtained from the patient. Yunuen presents for hoarseness, nasal congestion, post-nasal drip and cough that started on Friday. Used some cough drops and took some cough medicine which helped. She needs to use a CPAP machine with heated moist air. The next day, she woke up with hoarse voice. Has some abdominal pain and chest discomfort with cough.    She noticed a urinary odor, lower back pain, urinary frequency and dysuria on Friday. No hematuria or vaginal bleeding. No antibiotics in the past 30 days.  Her daughter-in-law told her to go to the urgent care but she waited. Has history of UTI's and even needed bladder biopsies.  She had a hysterectomy for uterine cancer in May.        Past Medical History:  Diagnosis Date  . Cancer [facility])     Patient Active Problem List   Diagnosis Date Noted  . Allergic rhinitis 01/25/2024  . Disability of walking 01/25/2024  . Edema of lower extremity 01/25/2024  . Hip pain 01/25/2024  . Hypokalemia 01/25/2024  . Localized edema 01/25/2024  . 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 Vaginal discharge 01/25/2024  . Vaginitis 01/25/2024  . Varicose veins of lower extremity 01/25/2024  . Generalized anxiety disorder 11/11/2023  . 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 Hyperglycemia due to type 2 diabetes mellitus (HCC) 08/09/2023  . Other low back pain 08/09/2023  . 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 Post-menopausal bleeding 07/07/2023  . Ovarian failure 04/26/2023  . Cervical spondylosis without myelopathy 04/16/2023  . Psoriasis 10/05/2022  . 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 Benign paroxysmal positional vertigo  09/23/2022  . Palpitations 03/26/2022  . Slow transit constipation 03/26/2022  . Perennial allergic rhinitis with seasonal variation 09/23/2021  . Atopic dermatitis 03/25/2021  . Increased frequency of urination 03/18/2021  . Depression 09/19/2020  . Insomnia 09/19/2020  . Sleep apnea 09/19/2020  . Syncope and collapse 11/09/2019  . History of adenomatous polyp of colon 06/30/2019  . Exercise-induced shortness of breath 04/04/2016  . Osteoarthritis of right knee 02/06/2016  . History of knee surgery 08/17/2015  . Does use hearing aid 07/26/2014  . Gastroesophageal reflux disease without esophagitis 01/22/2014  . Arteriosclerosis of coronary artery 01/22/2014  . Cobalamin deficiency 01/22/2014  . Dyspnea on exertion 01/22/2014  . Encounter for general adult medical examination without abnormal findings 01/22/2014  . Hyperlipidemia 01/22/2014  . Osteopenia 01/22/2014  . Acquired hypothyroidism 01/11/2014  . Spasm of back muscles 09/20/2013  . Essential (primary) hypertension 07/20/2013  . Family history of malignant neoplasm of colon 07/20/2013  . Mild recurrent major depression 07/14/2012  . Morbid obesity with body mass index (BMI) of 40.0 to 44.9 in adult [facility]) 07/14/2012  . Hyperglycemia 07/14/2012  . Fibromyalgia 12/18/2011  . Primary localized osteoarthritis 12/18/2011  . Rosacea 12/18/2011  . Hx of cardiac catheterization 07/05/2007    Past Surgical History:  Procedure Laterality Date  . ABDOMINAL HYSTERECTOMY      OB History   No obstetric history on file.      Home Medications    Prior to Admission medications   Medication Sig Start Date  End Date Taking? Authorizing Provider  cyanocobalamin (VITAMIN B12) 1000 MCG/ML injection Inject 1,000 mcg into the muscle every 14 (fourteen) days. 08/13/23  Yes [provider]  DULoxetine (CYMBALTA) 30 MG capsule Take 30 mg by mouth daily. 10/20/22  Yes [provider]  furosemide (LASIX) 40 MG tablet  furosemide 40 mg tablet 06/04/20  Yes [provider]  levothyroxine (SYNTHROID) 125 MCG tablet levothyroxine 125 mcg tablet 11/30/14  Yes [provider]  losartan (COZAAR) 50 MG tablet TAKE 1 TABLET(50 MG) BY MOUTH TWICE DAILY 12/18/14  Yes [provider]  meclizine (ANTIVERT) 25 MG tablet Take 1 tablet by mouth 3 (three) times daily.   Yes [provider]  meloxicam (MOBIC) 15 MG tablet Take 15 mg by mouth daily. 05/11/23  Yes [provider]  nitroGLYCERIN (NITROSTAT) 0.4 MG SL tablet nitroglycerin 0.4 mg sublingual tablet  Place by sublingual route.   Yes [provider]  Vitamin D, Ergocalciferol, (DRISDOL) 1.25 MG (50000 UNIT) CAPS capsule Take 50,000 Units by mouth 2 (two) times a week.   Yes [provider]  aspirin 325 MG tablet aspirin 325 mg tablet  Take 1 tablet every day by oral route.    [provider]  cyclobenzaprine (FLEXERIL) 10 MG tablet TAKE ONE TABLET BY MOUTH THREE TIMES DAILY AS NEEDED FOR SPASM 03/01/19   [provider]  rosuvastatin (CRESTOR) 10 MG tablet rosuvastatin 10 mg tablet 06/04/20   [provider]    Family History History reviewed. No pertinent family history.  Social History Social History   Tobacco Use  . Smoking status: Never  . Smokeless tobacco: Never  Vaping Use  . Vaping status: Never Used  Substance Use Topics  . Alcohol use: Not Currently     Allergies   Cinoxacin, Citalopram, Diltiazem, Escitalopram, Ezetimibe, Sertraline, Simvastatin, Chocolate flavoring agent (non-screening), Ezetimibe-simvastatin, Flavoring agent, Oxybutynin, Oxybutynin chloride, Oxycodone, Silicone, Sulfa antibiotics, Sulfamethoxazole-trimethoprim, and Oxycodone-acetaminophen   Review of Systems Review of Systems: negative unless otherwise stated in HPI.      Physical Exam Triage Vital Signs ED Triage Vitals  Encounter Vitals Group     BP 01/25/24 1237 123/63     Girls  Systolic BP Percentile --      Girls Diastolic BP Percentile --      Boys Systolic BP Percentile --      Boys Diastolic BP Percentile --      Pulse Rate 01/25/24 1237 96     Resp 01/25/24 1237 18     Temp 01/25/24 1237 98.7 F (37.1 C)     Temp Source 01/25/24 1237 Oral     SpO2 01/25/24 1237 93 %     Weight 01/25/24 1231 183 lb (83 kg)     Height --      Head Circumference --      Peak Flow --      Pain Score --      Pain Loc --      Pain Education --      Exclude from Growth Chart --    No data found.  Updated Vital Signs BP 123/63 (BP Location: Left Arm)   Pulse 96   Temp 98.7 F (37.1 C) (Oral)   Resp 18   Wt 83 kg   SpO2 93%   BMI 38.25 kg/m   Visual Acuity Right Eye Distance:   Left Eye Distance:   Bilateral Distance:    Right Eye Near:   Left Eye Near:  Bilateral Near:     Physical Exam GEN:     alert, non-toxic appearing female in no distress ***   HENT:  mucus membranes moist, oropharyngeal ***without lesions or ***erythema, no*** tonsillar hypertrophy or exudates, *** moderate erythematous edematous turbinates, ***clear nasal discharge, ***bilateral TM normal EYES:   pupils equal and reactive, ***no scleral injection or discharge NECK:  normal ROM, no ***lymphadenopathy, ***no meningismus   RESP:  no increased work of breathing, ***clear to auscultation bilaterally CVS:   regular rate ***and rhythm Skin:   warm and dry, no rash on visible skin***    UC Treatments / Results  Labs (all labs ordered are listed, but only abnormal results are displayed) Labs Reviewed  POCT URINE DIPSTICK - Abnormal; Notable for the following components:      Result Value   Bilirubin, UA small (*)    Ketones, POC UA trace (5) (*)    Blood, UA trace-intact (*)    Protein Ur, POC trace (*)    Leukocytes, UA Small (1+) (*)    All other components within normal limits  URINE CULTURE    EKG   Radiology No results found.   Procedures Procedures (including  critical care time)  Medications Ordered in UC Medications - No data to display  Initial Impression / Assessment and Plan / UC Course  I have reviewed the triage vital signs and the nursing notes.  Pertinent labs & imaging results that were available during my care of the patient were reviewed by me and considered in my medical decision making (see chart for details).       Pt is a 83 y.o. female who presents for *** days of respiratory symptoms. Asuna is ***afebrile here without recent antipyretics. Satting well on room air. Overall pt is ***non-toxic appearing, well hydrated, without respiratory distress. Pulmonary exam ***is unremarkable.  POC COVID and influenza panel obtained ***and was negative. ***POC strep is ***.  Strep throat culture sent.   Suspect ***viral respiratory illness. Discussed symptomatic treatment.  ***Promethazine DM for cough. ***Atrovent nasal spray for nasal congestion.  ***Explained lack of efficacy of antibiotics in viral disease.  Typical duration of symptoms discussed.   Return and ED precautions given and voiced understanding. Discussed MDM, treatment plan and plan for follow-up with patient*** who agrees with plan.     Final Clinical Impressions(s) / UC Diagnoses   Final diagnoses:  Acute cough   Discharge Instructions   None    ED Prescriptions   None    PDMP not reviewed this encounter.

## 2024-01-25 NOTE — Discharge Instructions (Addendum)
 Your COVID, influenza (A / B) and RSV (respiratory syncytial virus) are all negative. Your lung xray showed evidence of pneumonia.  I sent an antibiotic called doxycycline  to help with this.   You have a urinary tract infection. I sent your urine for culture to be sure the antibiotic prescribed (cephalexin ) will treat your infection. Someone may call you to change antibiotics. Stop by the pharmacy to pick up your prescriptions.  Follow up with your primary care provider or return to the urgent care, if not improving.

## 2024-01-25 NOTE — ED Triage Notes (Signed)
 Sx since Friday  Nasal drainage Cough- hurts to cough- uses C-pap Hoarse  Sx since Friday  Urinary frequency Dysuria

## 2024-01-27 ENCOUNTER — Ambulatory Visit: Payer: Self-pay

## 2024-01-27 LAB — URINE CULTURE: Culture: 30000 — AB

## 2024-03-17 ENCOUNTER — Ambulatory Visit

## 2024-03-22 ENCOUNTER — Encounter: Payer: Self-pay | Admitting: Podiatry

## 2024-03-22 ENCOUNTER — Ambulatory Visit: Admitting: Podiatry

## 2024-03-22 VITALS — Ht <= 58 in | Wt 183.0 lb

## 2024-03-22 DIAGNOSIS — L600 Ingrowing nail: Secondary | ICD-10-CM

## 2024-03-22 NOTE — Progress Notes (Signed)
" ° °  Chief Complaint  Patient presents with   Ingrown Toenail    Bil possible ingrown's causing pain.    HPI: 84 y.o. femalepresenting for evaluation of ingrown portion of toenail to the medial border of the bilateral great toes after receiving a pedicure.  Past Medical History:  Diagnosis Date   Cancer [facility])     Past Surgical History:  Procedure Laterality Date   ABDOMINAL HYSTERECTOMY      Allergies[1]   Physical Exam: General: The patient is alert and oriented x3 in no acute distress.  Dermatology: Skin is warm, dry and supple bilateral lower extremities.  Symptomatic ingrown toenails noted to the medial border of the bilateral great toes after a pedicure  Vascular: Palpable pedal pulses bilaterally. Capillary refill within normal limits.  No appreciable edema.  No erythema.  Neurological: Grossly intact via light touch  Musculoskeletal Exam: No pedal deformities noted   Assessment/Plan of Care: 1.  Mild ingrowing toenails bilateral great toes medial border  - Patient evaluated -Light debridement of the ingrowing portion of the toenail as well as the callus tissue around the area was performed today using a nail nipper and tissue nipper.  Patient felt significant relief.  She tolerated this well -Recommend wide fitting shoes that do not irritate or constrict the toebox area -Return to clinic PRN     Thresa EMERSON Sar, DPM [facility]  Dr. Thresa EMERSON Sar, DPM    [address] [place], [place] [address]                Office [phone]  Fax [phone]        [1]  Allergies Allergen Reactions   Cinoxacin Rash and Shortness Of Breath    Other Reaction(s): Not available, Unknown   Citalopram     Other Reaction(s): Dizziness, Not available, Unknown   Diltiazem     Other Reaction(s): Not available, Other, Other (See Comments)   Escitalopram     Other Reaction(s): dizziness   Ezetimibe     Other  Reaction(s): Not available, Other, Other (See Comments)   Sertraline     Other Reaction(s): Dizziness, Not available, Other (See Comments)   Simvastatin     Other Reaction(s): Not available, Other, Other (See Comments)   Chocolate Flavoring Agent (Non-Screening)     Other Reaction(s): Not available   Ezetimibe-Simvastatin     Other Reaction(s): other, Other (See Comments)  Legs hurt, back pain   Flavoring Agent Other (See Comments)   Oxybutynin     Other Reaction(s): Not available, Other (See Comments)   Oxybutynin Chloride Dermatitis and Other (See Comments)    oxybutynin chloride  Respiratory distress   Oxycodone     Other Reaction(s): Not available, Other (See Comments)   Silicone     Other Reaction(s): Unknown   Sulfa Antibiotics     Other Reaction(s): Not available   Sulfamethoxazole-Trimethoprim Other (See Comments)   Oxycodone-Acetaminophen Rash    Other Reaction(s): Not available   "

## 2024-03-28 ENCOUNTER — Ambulatory Visit: Admitting: Podiatry
